# Patient Record
Sex: Male | Born: 1990 | Race: White | Hispanic: No | Marital: Single | Smoking: Current some day smoker
Health system: Southern US, Community
[De-identification: ages and names within clinical notes are randomized; demographics above are authoritative.]

## PROBLEM LIST (undated history)

## (undated) DIAGNOSIS — N200 Calculus of kidney: Secondary | ICD-10-CM

## (undated) DIAGNOSIS — J45909 Unspecified asthma, uncomplicated: Secondary | ICD-10-CM

---

## 2017-05-20 ENCOUNTER — Encounter (HOSPITAL_COMMUNITY): Payer: Self-pay | Admitting: Emergency Medicine

## 2017-05-20 ENCOUNTER — Emergency Department (HOSPITAL_COMMUNITY): Payer: BLUE CROSS/BLUE SHIELD

## 2017-05-20 ENCOUNTER — Emergency Department (HOSPITAL_COMMUNITY)
Admission: EM | Admit: 2017-05-20 | Discharge: 2017-05-20 | Disposition: A | Discharge status: Home / Self Care | Payer: BLUE CROSS/BLUE SHIELD | Attending: Emergency Medicine | Admitting: Emergency Medicine

## 2017-05-20 DIAGNOSIS — F909 Attention-deficit hyperactivity disorder, unspecified type: Secondary | ICD-10-CM | POA: Diagnosis not present

## 2017-05-20 DIAGNOSIS — F17200 Nicotine dependence, unspecified, uncomplicated: Secondary | ICD-10-CM | POA: Diagnosis not present

## 2017-05-20 DIAGNOSIS — R0789 Other chest pain: Secondary | ICD-10-CM | POA: Insufficient documentation

## 2017-05-20 DIAGNOSIS — R079 Chest pain, unspecified: Secondary | ICD-10-CM | POA: Diagnosis present

## 2017-05-20 DIAGNOSIS — J45909 Unspecified asthma, uncomplicated: Secondary | ICD-10-CM | POA: Insufficient documentation

## 2017-05-20 DIAGNOSIS — Z79899 Other long term (current) drug therapy: Secondary | ICD-10-CM | POA: Insufficient documentation

## 2017-05-20 HISTORY — DX: Unspecified asthma, uncomplicated: J45.909

## 2017-05-20 LAB — D-DIMER, QUANTITATIVE: D-Dimer, Quant: <0.27 ug/mL-FEU (ref 0.00–0.50)

## 2017-05-20 LAB — BASIC METABOLIC PANEL
ANION GAP: 10 (ref 5–15)
BUN: 10 mg/dL (ref 6–20)
CO2: 24 mmol/L (ref 22–32)
Calcium: 9.7 mg/dL (ref 8.9–10.3)
Chloride: 103 mmol/L (ref 101–111)
Creatinine, Ser: 0.89 mg/dL (ref 0.61–1.24)
GFR calc Af Amer: >60 mL/min (ref >60)
GFR calc non Af Amer: >60 mL/min (ref >60)
GLUCOSE: 115 mg/dL — AB (ref 65–99)
POTASSIUM: 3.7 mmol/L (ref 3.5–5.1)
Sodium: 137 mmol/L (ref 135–145)

## 2017-05-20 LAB — CBC
HEMATOCRIT: 47.6 % (ref 39.0–52.0)
HEMOGLOBIN: 16.1 g/dL (ref 13.0–17.0)
MCH: 30.4 pg (ref 26.0–34.0)
MCHC: 33.8 g/dL (ref 30.0–36.0)
MCV: 90 fL (ref 78.0–100.0)
Platelets: 237 10*3/uL (ref 150–400)
RBC: 5.29 MIL/uL (ref 4.22–5.81)
RDW: 13 % (ref 11.5–15.5)
WBC: 9.1 10*3/uL (ref 4.0–10.5)

## 2017-05-20 LAB — TROPONIN I

## 2017-05-20 LAB — I-STAT TROPONIN, ED: Troponin i, poc: 0 ng/mL (ref 0.00–0.08)

## 2017-05-20 MED ORDER — GI COCKTAIL ~~LOC~~
30.0000 mL | Freq: Once | ORAL | Status: AC
  Administered 2017-05-20: 30 mL via ORAL
  Filled 2017-05-20: qty 30

## 2017-05-20 NOTE — ED Provider Notes (Signed)
[facility]-EMERGENCY DEPT Provider Note   CSN: [id number] Arrival date & time: 05/20/17  1507  History   Chief Complaint Chief Complaint  Patient presents with  . Chest Pain    HPI Robert Strong is a 26 y.o. male.  This is a 26 year old male with PMH of ADHD on Adderall who presents with chest pain which began suddenly at 17 PM last night when he was sitting on his couch watching TV.  He endorses nausea as well and he states his chest pain has decreased in intensity however was still present today which warranted a visit at urgent care where EKG was performed and chest x-ray performed.  He was sent to the ED for further workup given his continued right-sided chest pain which radiates to his right arm and has intermittent tingling of his right arm.  No family history of cardiac disease below the age of 61, no personal history of heart disease, no history of HTN, T2 DM, cocaine or other drug abuse.  He smokes half a pack of cigarettes daily, denies any illicit drug usage.  He states he missed his Adderall dose today.  Denies any history of PE, recent travel, fevers, night sweats, chills, cough, blurry vision, weakness, leg swelling.   The history is provided by the patient and medical records.    Past Medical History:  Diagnosis Date  . Asthma     There are no active problems to display for this patient.   History reviewed. No pertinent surgical history.     Home Medications    Prior to Admission medications   Not on File    Family History History reviewed. No pertinent family history.  Social History Social History  Substance Use Topics  . Smoking status: Current Every Day Smoker    Packs/day: 0.50  . Smokeless tobacco: Never Used  . Alcohol use Yes     Comment: socially     Allergies   Patient has no allergy information on record.   Review of Systems Review of Systems  Constitutional: Negative for chills and fever.  HENT: Negative for ear pain and sore  throat.   Eyes: Negative for pain and visual disturbance.  Respiratory: Positive for shortness of breath. Negative for cough.   Cardiovascular: Positive for chest pain. Negative for palpitations and leg swelling.  Gastrointestinal: Negative for abdominal pain and vomiting.  Genitourinary: Negative for dysuria and hematuria.  Musculoskeletal: Negative for arthralgias and back pain.  Skin: Negative for color change and rash.  Neurological: Negative for dizziness, tremors, seizures, syncope, speech difficulty, weakness, light-headedness, numbness and headaches.  All other systems reviewed and are negative.    Physical Exam Updated Vital Signs BP 116/81   Pulse 84   Temp 98.7 F (37.1 C) (Oral)   Resp 14   Ht  (1.778 m)   Wt 95.3 kg (210 lb)   SpO2 99%   BMI 30.13 kg/m   Physical Exam  Constitutional: He appears well-developed and well-nourished.  HENT:  Head: Normocephalic and atraumatic.  Eyes: Conjunctivae are normal.  Neck: Neck supple.  Cardiovascular: Normal rate, regular rhythm and normal pulses.  Exam reveals no decreased pulses.   No murmur heard. Pulmonary/Chest: Effort normal and breath sounds normal. No respiratory distress.  Abdominal: Soft. There is no tenderness.  Musculoskeletal: Normal range of motion. He exhibits no edema.  Neurological: He is alert.  Skin: Skin is warm and dry.  Psychiatric: He has a normal mood and affect.  Nursing note and  vitals reviewed.    ED Treatments / Results  Labs (all labs ordered are listed, but only abnormal results are displayed) Labs Reviewed  BASIC METABOLIC PANEL - Abnormal; Notable for the following:       Result Value   Glucose, Bld 115 (*)    All other components within normal limits  CBC  D-DIMER, QUANTITATIVE (NOT AT [facility])  TROPONIN I  I-STAT TROPONIN, ED    EKG  EKG Interpretation  Date/Time:  Thursday May 20 2017 15:12:56 EDT Ventricular Rate:  140 PR Interval:  128 QRS Duration: 90 QT  Interval:  288 QTC Calculation: 439 R Axis:   100 Text Interpretation:  Sinus tachycardia Right atrial enlargement Rightward axis Pulmonary disease pattern Junctional ST depression, probably normal Abnormal ECG No previous ECGs available Confirmed by Frederick Peers [phone]) on 05/20/2017 3:27:24 PM Also confirmed by Frederick Peers [phone]), editor Barbette Hair [phone])  on 05/20/2017 4:16:48 PM       Radiology Dg Chest 2 View  Result Date: 05/20/2017 CLINICAL DATA:  Chest pain beginning last night right side radiating down right arm with nausea. EXAM: CHEST  2 VIEW COMPARISON:  None. FINDINGS: Lungs are adequately inflated and otherwise clear. Cardiomediastinal silhouette is within normal. Evidence of an old right clavicle fracture. IMPRESSION: No active cardiopulmonary disease. Electronically Signed   By: Elberta Fortis M.D.   On: 05/20/2017 16:25    Procedures Procedures (including critical care time)  Medications Ordered in ED Medications  gi cocktail (Maalox,Lidocaine,Donnatal) (not administered)     Initial Impression / Assessment and Plan / ED Course  I have reviewed the triage vital signs and the nursing notes.  Pertinent labs & imaging results that were available during my care of the patient were reviewed by me and considered in my medical decision making (see chart for details).     This is a 26 year old male with PMH of ADHD on Adderall who presents with chest pain which began suddenly at 4 PM last night when he was sitting on his couch watching TV. He had this pain several weeks ago but it resolved on its own. He presented today due to right arm radiation of his pain.  Physical exam as detailed above.  ECG is normal sinus rhythm and rate, without evidence of ST or T wave changes of myocardial ischemia.    No EKG findings of HOCM, WPW, Brugada, pre-excitation or prolonged QT. No tachycardia, no S1Q3T3 or right ventricular heart strain suggestive of PE.    Because of the  age and risk factors of the patient, ACS will be ruled out with troponin x 2. Chest XR and EKG performed. EKG: there are no previous tracings available for comparison, sinus tachycardia.  HEART score calculation: 1  Unlikely PNA as CXR shows no acute cardiopulmonary abnormality, no leukocytosis, no cough, no fever.   Cannot rule out via PERC due to tachycardia. D-dimer performed and negative.  Doubt Aortic Dissection, Pancreatitis, Arrhythmia, Pneumothorax, Endo/Myo/Pericarditis, ulcer perforation or other emergent pathology.  Labs and imaging reviewed by myself and considered in medical decision making.  Imaging interpreted by radiology.   At this time, given age and lack of risk factors, I believe chest pain to be benign cause. Patient will be discharged home is follow up with PCP. Patient in agreement with plan. Return precautions given. All questions answered.  Final Clinical Impressions(s) / ED Diagnoses   Final diagnoses:  Chest wall pain   New Prescriptions New Prescriptions   No medications on file  Shaune Pollack, MD 05/20/17 2214    Clarene Duke Ambrose Finland, MD 05/21/17 [phone]

## 2017-05-20 NOTE — ED Notes (Signed)
Pt departed in NAD.  

## 2017-05-20 NOTE — ED Triage Notes (Signed)
Pt presents to ED for assessment of CP starting last night at 11pm.  Pt stats he had trouble sleeping because of it.  CP was on the right side, with shoulder pain, right arm numbness, diaphoresis, and one episode of vomiting.  Pt states CP has worsened today while at work, and is starting to move to the left side.  No cardiac history.

## 2019-10-12 ENCOUNTER — Ambulatory Visit: Payer: BC Managed Care – PPO | Attending: Family

## 2019-10-12 DIAGNOSIS — Z23 Encounter for immunization: Secondary | ICD-10-CM | POA: Insufficient documentation

## 2019-10-13 NOTE — Progress Notes (Signed)
   Covid-19 Vaccination Clinic  Name:  AARYAV HOPFENSPERGER    MRN: [id number] DOB: 1990/09/06  10/13/2019  Mr. Mi was observed post Covid-19 immunization for 30 minutes based on pre-vaccination screening without incidence. He was provided with Vaccine Information Sheet and instruction to access the V-Safe system.   Mr. Hoen was instructed to call 911 with any severe reactions post vaccine: Marland Kitchen Difficulty breathing  . Swelling of your face and throat  . A fast heartbeat  . A bad rash all over your body  . Dizziness and weakness    Immunizations Administered    Name Date Dose VIS Date Route   Moderna COVID-19 Vaccine 10/12/2019  5:24 AM 0.5 mL 08/01/2019 Intramuscular   Manufacturer: Moderna   Lot: [id number]   NDC: 99833-825-05

## 2019-11-14 ENCOUNTER — Ambulatory Visit: Payer: BC Managed Care – PPO | Attending: Family

## 2019-11-14 DIAGNOSIS — Z23 Encounter for immunization: Secondary | ICD-10-CM

## 2019-11-14 NOTE — Progress Notes (Signed)
   Covid-19 Vaccination Clinic  Name:  Robert Strong    MRN: [id number] DOB: [birth year]  11/14/2019  [name] was observed post Covid-19 immunization for 15 minutes without incident. He was provided with Vaccine Information Sheet and instruction to access the V-Safe system.   [name] was instructed to call 911 with any severe reactions post vaccine: Marland Kitchen Difficulty breathing  . Swelling of face and throat  . A fast heartbeat  . A bad rash all over body  . Dizziness and weakness   Immunizations Administered    Name Date Dose VIS Date Route   Moderna COVID-19 Vaccine 11/14/2019  2:20 PM 0.5 mL 08/01/2019 Intramuscular   Manufacturer: Moderna   Lot: [id number]   NDC: 83015-996-89

## 2021-06-30 ENCOUNTER — Emergency Department (HOSPITAL_COMMUNITY)
Admission: EM | Admit: 2021-06-30 | Discharge: 2021-07-01 | Disposition: A | Discharge status: Home / Self Care | Payer: BC Managed Care – PPO | Attending: Emergency Medicine | Admitting: Emergency Medicine

## 2021-06-30 ENCOUNTER — Emergency Department (HOSPITAL_COMMUNITY): Payer: BC Managed Care – PPO

## 2021-06-30 ENCOUNTER — Other Ambulatory Visit: Payer: Self-pay

## 2021-06-30 ENCOUNTER — Encounter (HOSPITAL_COMMUNITY): Payer: Self-pay

## 2021-06-30 DIAGNOSIS — N201 Calculus of ureter: Secondary | ICD-10-CM | POA: Insufficient documentation

## 2021-06-30 DIAGNOSIS — F17210 Nicotine dependence, cigarettes, uncomplicated: Secondary | ICD-10-CM | POA: Diagnosis not present

## 2021-06-30 DIAGNOSIS — R109 Unspecified abdominal pain: Secondary | ICD-10-CM | POA: Diagnosis present

## 2021-06-30 DIAGNOSIS — J45909 Unspecified asthma, uncomplicated: Secondary | ICD-10-CM | POA: Diagnosis not present

## 2021-06-30 LAB — CBC WITH DIFFERENTIAL/PLATELET
Abs Immature Granulocytes: 0.05 10*3/uL (ref 0.00–0.07)
Basophils Absolute: 0.1 10*3/uL (ref 0.0–0.1)
Basophils Relative: 0 %
Eosinophils Absolute: 0.1 10*3/uL (ref 0.0–0.5)
Eosinophils Relative: 1 %
HCT: 43.6 % (ref 39.0–52.0)
Hemoglobin: 14.5 g/dL (ref 13.0–17.0)
Immature Granulocytes: 0 %
Lymphocytes Relative: 9 %
Lymphs Abs: 1.4 10*3/uL (ref 0.7–4.0)
MCH: 30.4 pg (ref 26.0–34.0)
MCHC: 33.3 g/dL (ref 30.0–36.0)
MCV: 91.4 fL (ref 80.0–100.0)
Monocytes Absolute: 1.3 10*3/uL — ABNORMAL HIGH (ref 0.1–1.0)
Monocytes Relative: 8 %
Neutro Abs: 13.8 10*3/uL — ABNORMAL HIGH (ref 1.7–7.7)
Neutrophils Relative %: 82 %
Platelets: 265 10*3/uL (ref 150–400)
RBC: 4.77 MIL/uL (ref 4.22–5.81)
RDW: 12.8 % (ref 11.5–15.5)
WBC: 16.7 10*3/uL — ABNORMAL HIGH (ref 4.0–10.5)
nRBC: 0 % (ref 0.0–0.2)

## 2021-06-30 LAB — COMPREHENSIVE METABOLIC PANEL
ALT: 25 U/L (ref 0–44)
AST: 18 U/L (ref 15–41)
Albumin: 4.4 g/dL (ref 3.5–5.0)
Alkaline Phosphatase: 54 U/L (ref 38–126)
Anion gap: 11 (ref 5–15)
BUN: 17 mg/dL (ref 6–20)
CO2: 22 mmol/L (ref 22–32)
Calcium: 10.1 mg/dL (ref 8.9–10.3)
Chloride: 105 mmol/L (ref 98–111)
Creatinine, Ser: 0.85 mg/dL (ref 0.61–1.24)
GFR, Estimated: >60 mL/min (ref >60)
Glucose, Bld: 108 mg/dL — ABNORMAL HIGH (ref 70–99)
Potassium: 3.5 mmol/L (ref 3.5–5.1)
Sodium: 138 mmol/L (ref 135–145)
Total Bilirubin: 0.5 mg/dL (ref 0.3–1.2)
Total Protein: 8.1 g/dL (ref 6.5–8.1)

## 2021-06-30 LAB — LIPASE, BLOOD: Lipase: 35 U/L (ref 11–51)

## 2021-06-30 MED ORDER — HYDROMORPHONE HCL 1 MG/ML IJ SOLN
1.0000 mg | Freq: Once | INTRAMUSCULAR | Status: AC
  Administered 2021-07-01: 1 mg via INTRAVENOUS
  Filled 2021-06-30: qty 1

## 2021-06-30 MED ORDER — ONDANSETRON HCL 4 MG/2ML IJ SOLN
4.0000 mg | Freq: Once | INTRAMUSCULAR | Status: AC
  Administered 2021-07-01: 4 mg via INTRAVENOUS
  Filled 2021-06-30: qty 2

## 2021-06-30 MED ORDER — OXYCODONE-ACETAMINOPHEN 5-325 MG PO TABS
1.0000 | ORAL_TABLET | Freq: Once | ORAL | Status: AC
  Administered 2021-06-30: 1 via ORAL
  Filled 2021-06-30: qty 1

## 2021-06-30 MED ORDER — ONDANSETRON 8 MG PO TBDP
8.0000 mg | ORAL_TABLET | Freq: Once | ORAL | Status: AC
  Administered 2021-06-30: 8 mg via ORAL
  Filled 2021-06-30: qty 1

## 2021-06-30 NOTE — ED Notes (Signed)
Labeled specimen cup provided to pt for urine collection per MD order. ENMiles 

## 2021-06-30 NOTE — ED Provider Notes (Signed)
Emergency Medicine Provider Triage Evaluation Note  Robert Strong , a 30 y.o. male  was evaluated in triage.  Pt complains of right flank pain.  Started acutely 1 hour ago.  Having some dysuria, no hematuria.  No prior abdominal surgeries, associated with 1 episode of emesis and nausea..   Review of Systems  Positive: Right flank pain, dysuria, nausea, vomiting Negative: Hematuria  Physical Exam  BP (!) 146/104 (BP Location: Left Arm)   Pulse 70   Temp 97.6 F (36.4 C) (Oral)   Resp 18   SpO2 100%  Gen:   Awake, no distress   Resp:  Normal effort  MSK:   Moves extremities without difficulty  Other:  Right CVA tenderness  Medical Decision Making  Medically screening exam initiated at 7:05 PM.  Appropriate orders placed.  Robert Strong was informed that the remainder of the evaluation will be completed by another provider, this initial triage assessment does not replace that evaluation, and the importance of remaining in the ED until their evaluation is complete.  Abdominal pain work-up   Theron Arista, Cordelia Poche 06/30/21 1906    Jacalyn Lefevre, MD 06/30/21 2015

## 2021-06-30 NOTE — ED Provider Notes (Signed)
[facility]-EMERGENCY DEPT Provider Note   CSN: [id number] Arrival date & time: 06/30/21  1837     History Chief Complaint  Patient presents with   Flank Pain    Robert Strong is a 30 y.o. male.  The history is provided by the patient and medical records.  Flank Pain  30 year old male with history of asthma, presenting to the ED with right-sided flank pain.  States it began about 1 hour prior to arrival.  States started in right groin but now seems more localized to his right flank.  States history of kidney stones in the past with similar symptoms.  Emesis x6 since pain began, seems to be coming in waves.  He has not had any fever.  Prior stone passed spontaneously, no required stenting or lithotripsy.  Did have some pain medication in triage but that is since worn off.  Past Medical History:  Diagnosis Date   Asthma     There are no problems to display for this patient.   History reviewed. No pertinent surgical history.     History reviewed. No pertinent family history.  Social History   Tobacco Use   Smoking status: Every Day    Packs/day: 0.50    Types: Cigarettes   Smokeless tobacco: Never  Substance Use Topics   Alcohol use: Yes    Comment: socially   Drug use: Yes    Types: Marijuana    Home Medications Prior to Admission medications   Medication Sig Start Date End Date Taking? Authorizing Provider  ondansetron (ZOFRAN ODT) 4 MG disintegrating tablet Take 1 tablet (4 mg total) by mouth every 8 (eight) hours as needed for nausea. 07/01/21  Yes Garlon Hatchet, PA-C  oxyCODONE-acetaminophen (PERCOCET) 5-325 MG tablet Take 1 tablet by mouth every 4 (four) hours as needed. 07/01/21  Yes Garlon Hatchet, PA-C  tamsulosin (FLOMAX) 0.4 MG CAPS capsule Take 1 capsule (0.4 mg total) by mouth daily after supper. 07/01/21  Yes Garlon Hatchet, PA-C    Allergies    Patient has no known allergies.  Review of Systems   Review of Systems   Gastrointestinal:  Positive for nausea and vomiting.  Genitourinary:  Positive for flank pain.  All other systems reviewed and are negative.  Physical Exam Updated Vital Signs BP (!) 170/96   Pulse 82   Temp 97.6 F (36.4 C) (Oral)   Resp 15   SpO2 96%   Physical Exam Vitals and nursing note reviewed.  Constitutional:      Appearance: He is well-developed.  HENT:     Head: Normocephalic and atraumatic.  Eyes:     Conjunctiva/sclera: Conjunctivae normal.     Pupils: Pupils are equal, round, and reactive to light.  Cardiovascular:     Rate and Rhythm: Normal rate and regular rhythm.     Heart sounds: Normal heart sounds.  Pulmonary:     Effort: Pulmonary effort is normal. No respiratory distress.     Breath sounds: Normal breath sounds. No rhonchi.  Abdominal:     General: Bowel sounds are normal.     Palpations: Abdomen is soft.     Tenderness: There is no abdominal tenderness. There is no rebound.  Musculoskeletal:        General: Normal range of motion.     Cervical back: Normal range of motion.  Skin:    General: Skin is warm and dry.  Neurological:     Mental Status: He is alert and  oriented to person, place, and time.    ED Results / Procedures / Treatments   Labs (all labs ordered are listed, but only abnormal results are displayed) Labs Reviewed  CBC WITH DIFFERENTIAL/PLATELET - Abnormal; Notable for the following components:      Result Value   WBC 16.7 (*)    Neutro Abs 13.8 (*)    Monocytes Absolute 1.3 (*)    All other components within normal limits  COMPREHENSIVE METABOLIC PANEL - Abnormal; Notable for the following components:   Glucose, Bld 108 (*)    All other components within normal limits  URINALYSIS, ROUTINE W REFLEX MICROSCOPIC - Abnormal; Notable for the following components:   Color, Urine STRAW (*)    Hgb urine dipstick MODERATE (*)    All other components within normal limits  LIPASE, BLOOD    EKG None  Radiology CT Renal  Stone Study  Result Date: 07/01/2021 CLINICAL DATA:  Flank pain.  Concern for kidney stone. EXAM: CT ABDOMEN AND PELVIS WITHOUT CONTRAST TECHNIQUE: Multidetector CT imaging of the abdomen and pelvis was performed following the standard protocol without IV contrast. COMPARISON:  None. FINDINGS: Evaluation of this exam is limited in the absence of intravenous contrast. Lower chest: Minimal bibasilar dependent atelectasis. The visualized lung bases are otherwise clear. No intra-abdominal free air or free fluid. Hepatobiliary: No focal liver abnormality is seen. No gallstones, gallbladder wall thickening, or biliary dilatation. Pancreas: Unremarkable. No pancreatic ductal dilatation or surrounding inflammatory changes. Spleen: Normal in size without focal abnormality. Adrenals/Urinary Tract: The adrenal glands are unremarkable. Several nonobstructing left renal calculi measure up to 3 mm. There is no hydronephrosis on the left. No stone noted in the right kidney. There is a 6 mm distal right ureteral calculus with mild right hydronephrosis. The urinary bladder is unremarkable. Stomach/Bowel: There is no bowel obstruction or active inflammation. The appendix is normal. Vascular/Lymphatic: The abdominal aorta and IVC unremarkable. No portal venous gas. There is no adenopathy. Reproductive: The prostate and seminal vesicles are grossly unremarkable. No pelvic mass. Other: Small fat containing umbilical hernia. Musculoskeletal: No acute or significant osseous findings. IMPRESSION: 1. A 6 mm distal right ureteral calculus with mild right hydronephrosis. 2. Nonobstructing left renal calculi. No hydronephrosis on the left. 3. No bowel obstruction. Normal appendix. Electronically Signed   By: Anner Crete M.D.   On: 07/01/2021 01:22    Procedures Procedures   Medications Ordered in ED Medications  oxyCODONE-acetaminophen (PERCOCET/ROXICET) 5-325 MG per tablet 1 tablet (has no administration in time range)   ondansetron (ZOFRAN-ODT) disintegrating tablet 8 mg (8 mg Oral Given 06/30/21 2122)  oxyCODONE-acetaminophen (PERCOCET/ROXICET) 5-325 MG per tablet 1 tablet (1 tablet Oral Given 06/30/21 2122)  HYDROmorphone (DILAUDID) injection 1 mg (1 mg Intravenous Given 07/01/21 0041)  ondansetron (ZOFRAN) injection 4 mg (4 mg Intravenous Given 07/01/21 0042)  HYDROmorphone (DILAUDID) injection 1 mg (1 mg Intravenous Given 07/01/21 0201)  ketorolac (TORADOL) 30 MG/ML injection 30 mg (30 mg Intravenous Given 07/01/21 0201)    ED Course  I have reviewed the triage vital signs and the nursing notes.  Pertinent labs & imaging results that were available during my care of the patient were reviewed by me and considered in my medical decision making (see chart for details).    MDM Rules/Calculators/A&P                           30 year old male presenting to the ED with right-sided flank pain  that began 1 hour prior to arrival.  History of kidney stones and reports this feels similar.  He is afebrile and nontoxic.  He is not have any focal CVA tenderness.  Labs overall reassuring.  UA is pending.  Will get CT renal stone study.  Dilaudid given for pain.  CT renal stone with 6 mm distal right ureteral calculus.  There is some mild hydronephrosis.  Has been urinating here, hematuria without signs of infection.  Patient is much more comfortable after few doses of pain medication.  Feel he is stable for discharge.  We have discussed expectant management.  He is given urology follow-up.  Can return here for any new or acute changes.  Final Clinical Impression(s) / ED Diagnoses Final diagnoses:  Ureteral stone    Rx / DC Orders ED Discharge Orders          Ordered    oxyCODONE-acetaminophen (PERCOCET) 5-325 MG tablet  Every 4 hours PRN        07/01/21 0248    ondansetron (ZOFRAN ODT) 4 MG disintegrating tablet  Every 8 hours PRN        07/01/21 0248    tamsulosin (FLOMAX) 0.4 MG CAPS capsule  Daily after  supper        07/01/21 0248             Larene Pickett, PA-C 07/01/21 0254    Ezequiel Essex, MD 07/01/21 [phone]

## 2021-06-30 NOTE — ED Triage Notes (Signed)
Pt reports right sided flank pain that began about 30-40 minutes ago. Denies N/V/D and urinary problems.

## 2021-07-01 ENCOUNTER — Emergency Department (HOSPITAL_COMMUNITY): Payer: BC Managed Care – PPO

## 2021-07-01 LAB — URINALYSIS, ROUTINE W REFLEX MICROSCOPIC
Bacteria, UA: NONE SEEN
Bilirubin Urine: NEGATIVE
Glucose, UA: NEGATIVE mg/dL
Ketones, ur: NEGATIVE mg/dL
Leukocytes,Ua: NEGATIVE
Nitrite: NEGATIVE
Protein, ur: NEGATIVE mg/dL
Specific Gravity, Urine: 1.01 (ref 1.005–1.030)
pH: 6 (ref 5.0–8.0)

## 2021-07-01 MED ORDER — OXYCODONE-ACETAMINOPHEN 5-325 MG PO TABS
1.0000 | ORAL_TABLET | Freq: Once | ORAL | Status: AC
  Administered 2021-07-01: 1 via ORAL
  Filled 2021-07-01: qty 1

## 2021-07-01 MED ORDER — HYDROMORPHONE HCL 1 MG/ML IJ SOLN
1.0000 mg | Freq: Once | INTRAMUSCULAR | Status: AC
  Administered 2021-07-01: 1 mg via INTRAVENOUS
  Filled 2021-07-01: qty 1

## 2021-07-01 MED ORDER — KETOROLAC TROMETHAMINE 30 MG/ML IJ SOLN
30.0000 mg | Freq: Once | INTRAMUSCULAR | Status: AC
  Administered 2021-07-01: 30 mg via INTRAVENOUS
  Filled 2021-07-01: qty 1

## 2021-07-01 MED ORDER — OXYCODONE-ACETAMINOPHEN 5-325 MG PO TABS: 1.0000 | ORAL_TABLET | ORAL | 0 refills | Status: AC | PRN

## 2021-07-01 MED ORDER — ONDANSETRON 4 MG PO TBDP: 4.0000 mg | ORAL_TABLET | Freq: Three times a day (TID) | ORAL | 0 refills | Status: AC | PRN

## 2021-07-01 MED ORDER — TAMSULOSIN HCL 0.4 MG PO CAPS: 0.4000 mg | ORAL_CAPSULE | Freq: Every day | ORAL | 0 refills | Status: AC

## 2021-07-01 NOTE — Discharge Instructions (Signed)
Take the prescribed medication as directed.  Make sure to drink lots of water. Follow-up with urology-- can call for appt. Return to the ED for new or worsening symptoms-- fever, uncontrolled pain, etc.

## 2021-07-01 NOTE — ED Notes (Signed)
Patient transported to CT 

## 2022-06-29 IMAGING — CT CT RENAL STONE PROTOCOL
2 of 4 series · 16 of 46 positions shown, 18 images · non-contrast
Comparison: None.

CLINICAL DATA: Flank pain.  Concern for kidney stone.

EXAM:
CT ABDOMEN AND PELVIS WITHOUT CONTRAST
TECHNIQUE: Multidetector CT imaging of the abdomen and pelvis was performed
following the standard protocol without IV contrast.

[Series 2: axial st · axial · 0.77mm/px · z∈[+1178,+1613]mm · 13 of 99 slices shown, 15 images]
[im 6/99  soft-tissue]
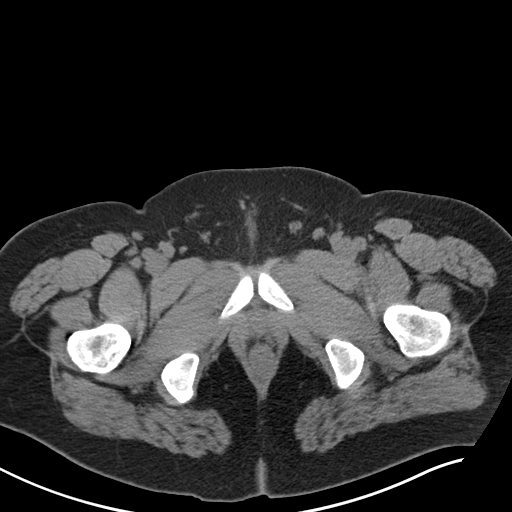
[im 6/99  bone]
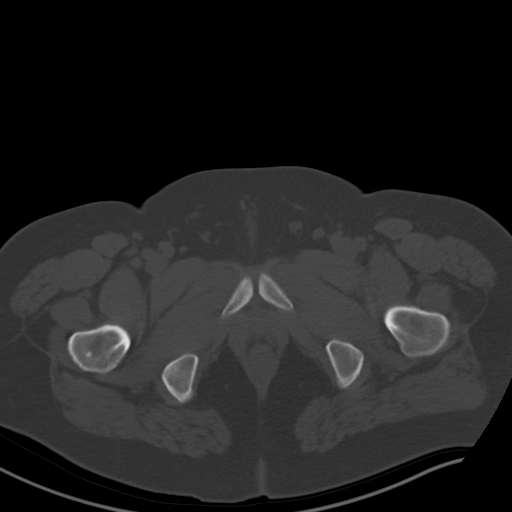
[im 16/99  soft-tissue]
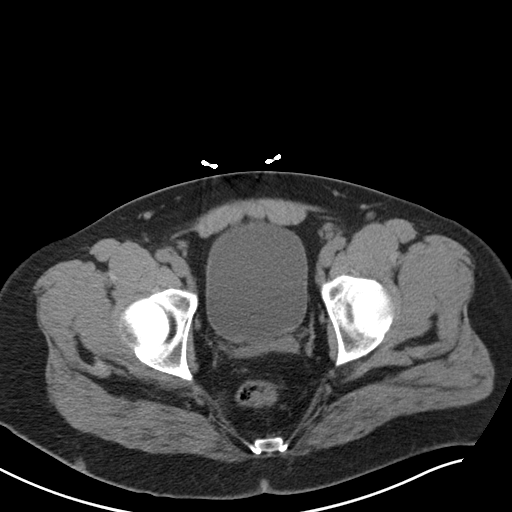
[im 21/99  soft-tissue]
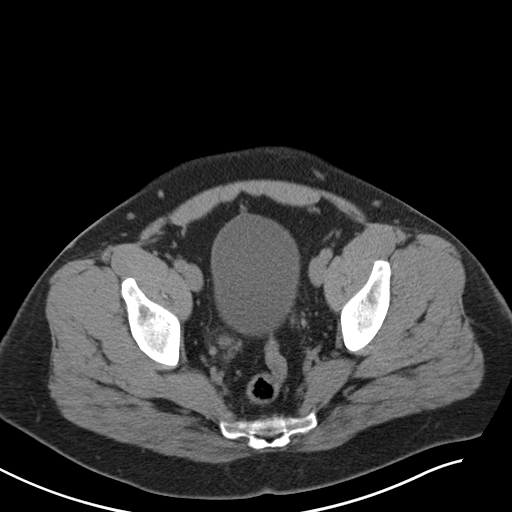
[im 26/99  soft-tissue]
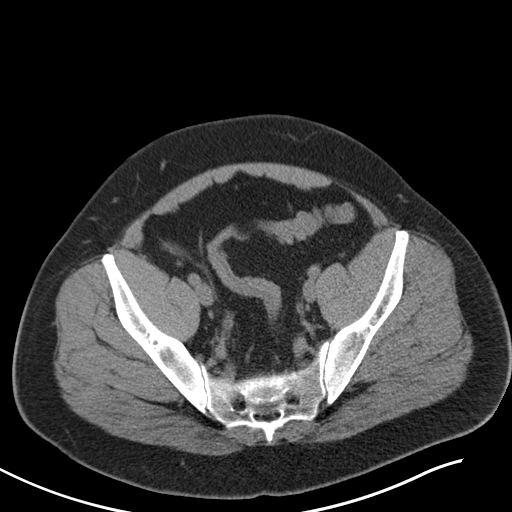
[im 37/99  soft-tissue]
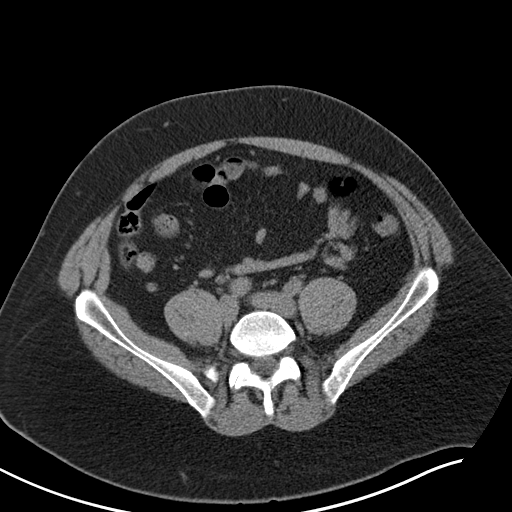
[im 42/99  soft-tissue]
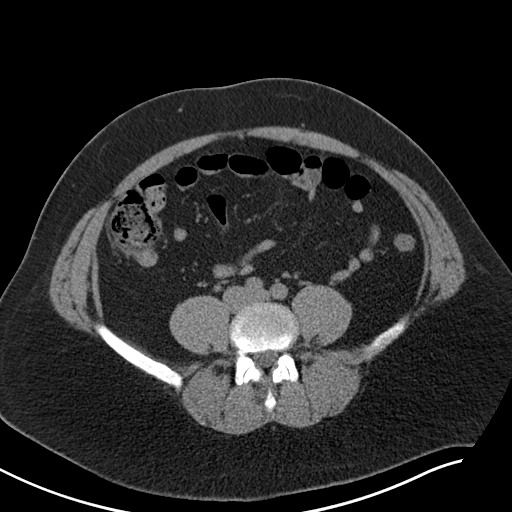
[im 52/99  soft-tissue]
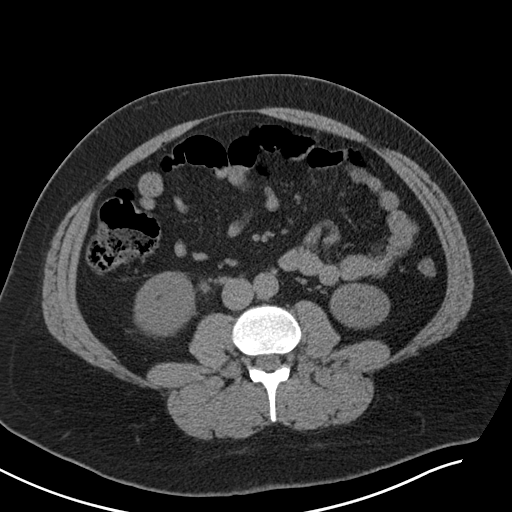
[im 57/99  soft-tissue]
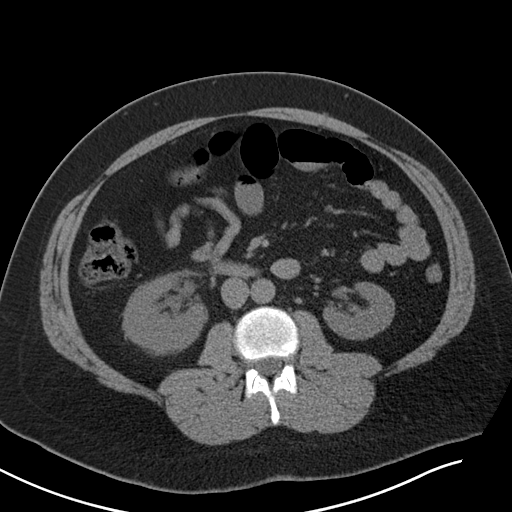
[im 62/99  soft-tissue]
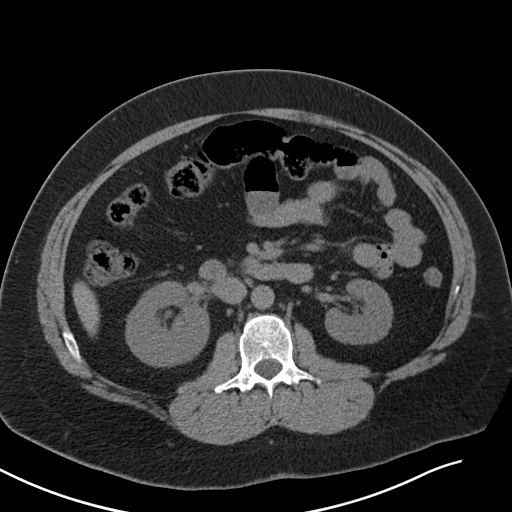
[im 62/99  bone]
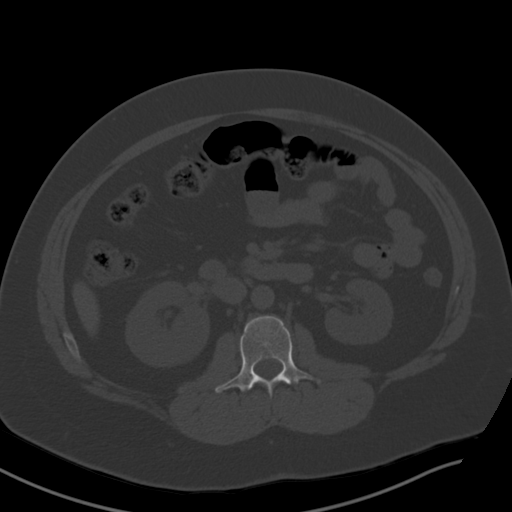
[im 73/99  soft-tissue]
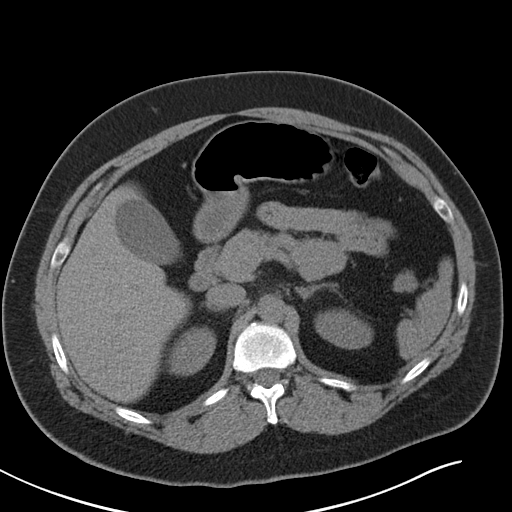
[im 78/99  soft-tissue]
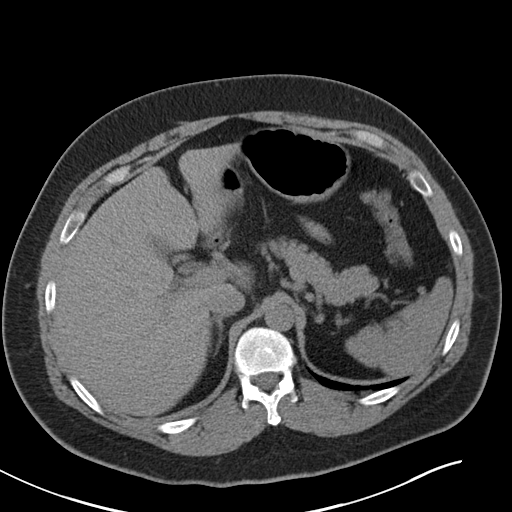
[im 83/99  soft-tissue]
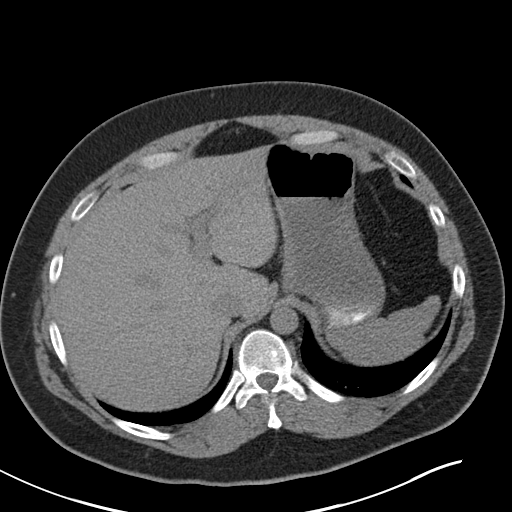
[im 93/99  soft-tissue]
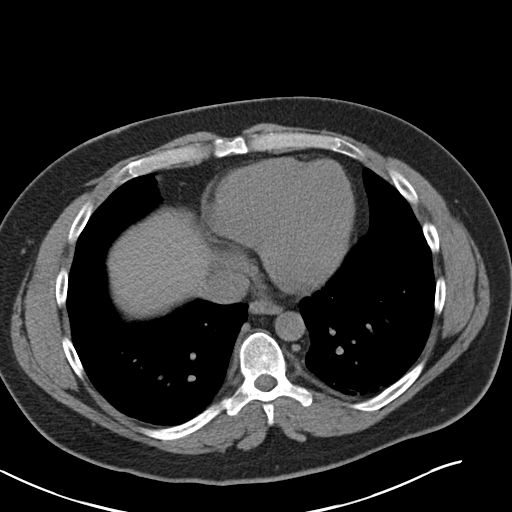

[Series 4: coronal · coronal · 0.83mm/px · 3 of 151 slices shown]
[im 51/151  soft-tissue]
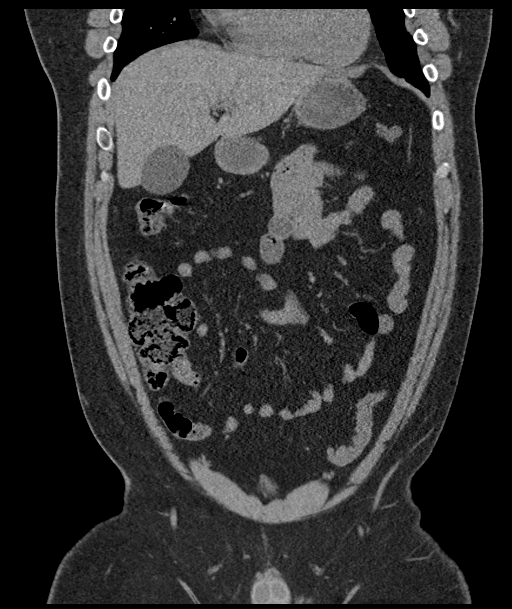
[im 67/151  soft-tissue]
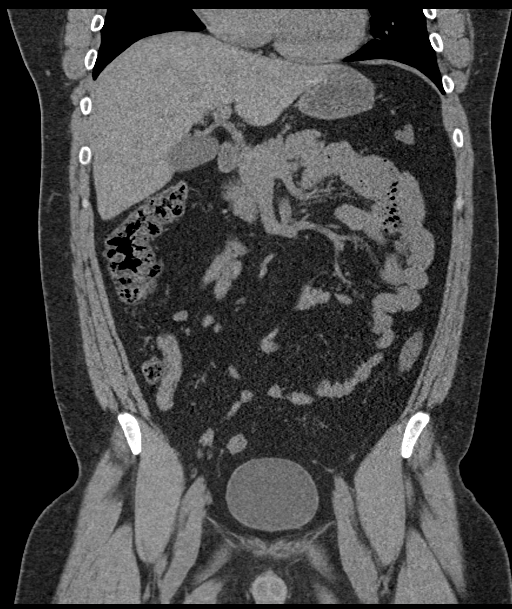
[im 84/151  soft-tissue]
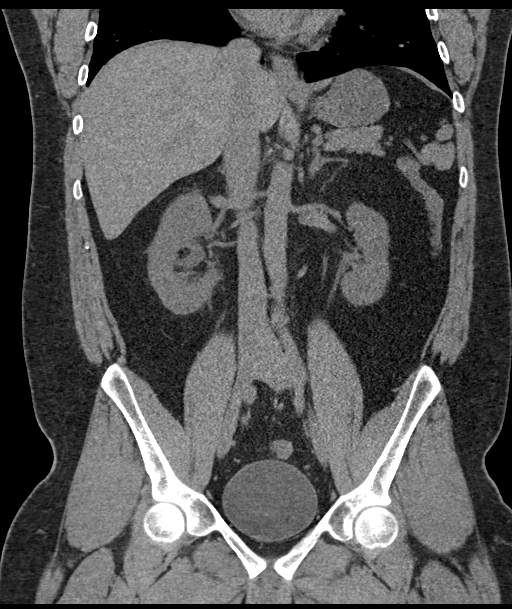

[16 of 46 positions shown; findings below may reference images not displayed]

FINDINGS: Evaluation of this exam is limited in the absence of intravenous
contrast.

Lower chest: Minimal bibasilar dependent atelectasis. The visualized
lung bases are otherwise clear.

No intra-abdominal free air or free fluid.

Hepatobiliary: No focal liver abnormality is seen. No gallstones,
gallbladder wall thickening, or biliary dilatation.

Pancreas: Unremarkable. No pancreatic ductal dilatation or
surrounding inflammatory changes.

Spleen: Normal in size without focal abnormality.

Adrenals/Urinary Tract: The adrenal glands are unremarkable. Several
nonobstructing left renal calculi measure up to 3 mm. There is no
hydronephrosis on the left. No stone noted in the right kidney.
There is a 6 mm distal right ureteral calculus with mild right
hydronephrosis. The urinary bladder is unremarkable.

Stomach/Bowel: There is no bowel obstruction or active inflammation.
The appendix is normal.

Vascular/Lymphatic: The abdominal aorta and IVC unremarkable. No
portal venous gas. There is no adenopathy.

Reproductive: The prostate and seminal vesicles are grossly
unremarkable. No pelvic mass.

Other: Small fat containing umbilical hernia.

Musculoskeletal: No acute or significant osseous findings.
IMPRESSION: 1. A 6 mm distal right ureteral calculus with mild right
hydronephrosis.
2. Nonobstructing left renal calculi. No hydronephrosis on the left.
3. No bowel obstruction. Normal appendix.

## 2022-09-06 ENCOUNTER — Ambulatory Visit
Admission: EM | Admit: 2022-09-06 | Discharge: 2022-09-06 | Disposition: A | Discharge status: Home / Self Care | Payer: No Typology Code available for payment source | Attending: Urgent Care | Admitting: Urgent Care

## 2022-09-06 DIAGNOSIS — R062 Wheezing: Secondary | ICD-10-CM

## 2022-09-06 DIAGNOSIS — B349 Viral infection, unspecified: Secondary | ICD-10-CM | POA: Insufficient documentation

## 2022-09-06 DIAGNOSIS — Z79899 Other long term (current) drug therapy: Secondary | ICD-10-CM | POA: Diagnosis not present

## 2022-09-06 DIAGNOSIS — R058 Other specified cough: Secondary | ICD-10-CM | POA: Insufficient documentation

## 2022-09-06 DIAGNOSIS — F17210 Nicotine dependence, cigarettes, uncomplicated: Secondary | ICD-10-CM | POA: Diagnosis not present

## 2022-09-06 DIAGNOSIS — J4520 Mild intermittent asthma, uncomplicated: Secondary | ICD-10-CM | POA: Diagnosis not present

## 2022-09-06 DIAGNOSIS — Z1152 Encounter for screening for COVID-19: Secondary | ICD-10-CM | POA: Insufficient documentation

## 2022-09-06 HISTORY — DX: Calculus of kidney: N20.0

## 2022-09-06 MED ORDER — ALBUTEROL SULFATE HFA 108 (90 BASE) MCG/ACT IN AERS: 1.0000 | INHALATION_SPRAY | Freq: Four times a day (QID) | RESPIRATORY_TRACT | 0 refills | Status: AC | PRN

## 2022-09-06 MED ORDER — PROMETHAZINE-DM 6.25-15 MG/5ML PO SYRP: 5.0000 mL | ORAL_SOLUTION | Freq: Three times a day (TID) | ORAL | 0 refills | Status: AC | PRN

## 2022-09-06 MED ORDER — PREDNISONE 20 MG PO TABS: ORAL_TABLET | ORAL | 0 refills | Status: AC

## 2022-09-06 MED ORDER — CETIRIZINE HCL 10 MG PO TABS: 10.0000 mg | ORAL_TABLET | Freq: Every day | ORAL | 0 refills | Status: AC

## 2022-09-06 NOTE — ED Provider Notes (Signed)
[facility]  Note:  This document was prepared using Systems analyst and may include unintentional dictation errors.  MRN: [id number] DOB: [birth year]  Subjective:   Robert Strong is a 32 y.o. male presenting for 4-day history of acute onset coughing, sinus congestion, chest congestion, wheezing, shortness of breath, malaise and fatigue.  Has a history of intermittent asthma, has not needed an inhaler in years.  Patient smokes a few times per week.  No current facility-administered medications for this encounter.  Current Outpatient Medications:    ondansetron (ZOFRAN ODT) 4 MG disintegrating tablet, Take 1 tablet (4 mg total) by mouth every 8 (eight) hours as needed for nausea., Disp: 10 tablet, Rfl: 0   oxyCODONE-acetaminophen (PERCOCET) 5-325 MG tablet, Take 1 tablet by mouth every 4 (four) hours as needed., Disp: 20 tablet, Rfl: 0   tamsulosin (FLOMAX) 0.4 MG CAPS capsule, Take 1 capsule (0.4 mg total) by mouth daily after supper., Disp: 10 capsule, Rfl: 0   No Known Allergies  Past Medical History:  Diagnosis Date   Asthma    Kidney stone      History reviewed. No pertinent surgical history.  No family history on file.  Social History   Tobacco Use   Smoking status: Some Days    Packs/day: 0.50    Types: Cigarettes   Smokeless tobacco: Never  Vaping Use   Vaping Use: Never used  Substance Use Topics   Alcohol use: Yes    Comment: socially   Drug use: Not Currently    ROS   Objective:   Vitals: BP (!) 139/94 (BP Location: Left Arm)   Pulse 88   Temp 98.5 F (36.9 C) (Oral)   Resp 18   SpO2 96%   Physical Exam Constitutional:      General: He is not in acute distress.    Appearance: Normal appearance. He is well-developed and normal weight. He is not ill-appearing, toxic-appearing or diaphoretic.  HENT:     Head: Normocephalic and atraumatic.     Right Ear: Tympanic membrane, ear canal and external ear  normal. No drainage, swelling or tenderness. No middle ear effusion. There is no impacted cerumen. Tympanic membrane is not erythematous or bulging.     Left Ear: Tympanic membrane, ear canal and external ear normal. No drainage, swelling or tenderness.  No middle ear effusion. There is no impacted cerumen. Tympanic membrane is not erythematous or bulging.     Nose: Congestion and rhinorrhea present.     Mouth/Throat:     Mouth: Mucous membranes are moist.     Pharynx: No oropharyngeal exudate or posterior oropharyngeal erythema.  Eyes:     General: No scleral icterus.       Right eye: No discharge.        Left eye: No discharge.     Extraocular Movements: Extraocular movements intact.     Conjunctiva/sclera: Conjunctivae normal.  Cardiovascular:     Rate and Rhythm: Normal rate and regular rhythm.     Heart sounds: Normal heart sounds. No murmur heard.    No friction rub. No gallop.  Pulmonary:     Effort: Pulmonary effort is normal. No respiratory distress.     Breath sounds: No stridor. Wheezing (Mild over mid lung fields bilaterally) present. No rhonchi or rales.  Musculoskeletal:     Cervical back: Normal range of motion and neck supple. No rigidity. No muscular tenderness.  Neurological:     General: No focal  deficit present.     Mental Status: He is alert and oriented to person, place, and time.  Psychiatric:        Mood and Affect: Mood normal.        Behavior: Behavior normal.        Thought Content: Thought content normal.     Assessment and Plan :   PDMP not reviewed this encounter.  1. Acute viral syndrome   2. Mild intermittent asthma without complication   3. Wheezing     In the context of his respiratory symptoms, asthma and smoking recommended oral prednisone course, albuterol inhaler.  Deferred imaging given clear cardiopulmonary exam, hemodynamically stable vital signs. Will manage for viral illness such as viral URI, viral syndrome, viral rhinitis, COVID-19.  Recommended supportive care. Offered scripts for symptomatic relief. Testing is pending. Counseled patient on potential for adverse effects with medications prescribed/recommended today, ER and return-to-clinic precautions discussed, patient verbalized understanding.   Patient should undergo treatment with Paxlovid should he test positive for COVID-19.   Wallis Bamberg, [place] 09/06/22 1157

## 2022-09-06 NOTE — ED Triage Notes (Signed)
Pt c/o cough, head/chest congestion x 4 days-NAD-steady gait 

## 2022-09-06 NOTE — Discharge Instructions (Signed)
We will notify you of your test results as they arrive and may take between about 24 hours.  I encourage you to sign up for MyChart if you have not already done so as this can be the easiest way for us to communicate results to you online or through a phone app.  Generally, we only contact you if it is a positive test result.  In the meantime, if you develop worsening symptoms including fever, chest pain, shortness of breath despite our current treatment plan then please report to the emergency room as this may be a sign of worsening status from possible viral infection.  Otherwise, we will manage this as a viral syndrome. For sore throat or cough try using a honey-based tea. Use 3 teaspoons of honey with juice squeezed from half lemon. Place shaved pieces of ginger into 1/2-1 cup of water and warm over stove top. Then mix the ingredients and repeat every 4 hours as needed. Please take Tylenol 500mg-650mg every 6 hours for aches and pains, fevers. Hydrate very well with at least 2 liters of water. Eat light meals such as soups to replenish electrolytes and soft fruits, veggies. Start an antihistamine like Zyrtec for postnasal drainage, sinus congestion.  You can take this together with prednisone and albuterol.  Use the cough medications as needed.   

## 2022-09-07 LAB — SARS CORONAVIRUS 2 (TAT 6-24 HRS): SARS Coronavirus 2: NEGATIVE

## 2024-05-16 ENCOUNTER — Encounter (HOSPITAL_COMMUNITY): Payer: Self-pay | Admitting: *Deleted

## 2024-05-16 ENCOUNTER — Ambulatory Visit (HOSPITAL_COMMUNITY): Admission: EM | Admit: 2024-05-16 | Discharge: 2024-05-16 | Disposition: A | Discharge status: Home / Self Care

## 2024-05-16 DIAGNOSIS — L0291 Cutaneous abscess, unspecified: Secondary | ICD-10-CM

## 2024-05-16 MED ORDER — SULFAMETHOXAZOLE-TRIMETHOPRIM 800-160 MG PO TABS: 2.0000 | ORAL_TABLET | Freq: Two times a day (BID) | ORAL | 0 refills | Status: AC

## 2024-05-16 NOTE — ED Triage Notes (Signed)
 Pt states he has a cyst on his left inner thigh X 5 days he has done warm compress but no relief.

## 2024-05-16 NOTE — ED Provider Notes (Signed)
 [facility]    CSN: [id number] Arrival date & time: 05/16/24  1027    HISTORY   Chief Complaint  Patient presents with   Abscess   HPI Robert Strong is a pleasant, 33 y.o. male who presents to urgent care today. Patient reports a history of folliculitis which typically resolves with topical antibiotic treatment.  Patient states he has a lesion in his left inner thigh to his groin that has not responded to topical treatment, has gotten much bigger over the past 5 days and is now erythematous and tender to touch.  Patient states he has been applying a warm compress to the area without meaningful relief.  Denies fever, red streaking, drainage from lesion.  The history is provided by the patient.  Abscess  Past Medical History:  Diagnosis Date   Asthma    Kidney stone    There are no active problems to display for this patient.  History reviewed. No pertinent surgical history.  Home Medications    Prior to Admission medications   Medication Sig Start Date End Date Taking? Authorizing Provider  albuterol  (VENTOLIN  HFA) 108 (90 Base) MCG/ACT inhaler Inhale 1-2 puffs into the lungs every 6 (six) hours as needed for wheezing or shortness of breath. 09/06/22   Robert Savannah, PA-C  cetirizine  (ZYRTEC  ALLERGY) 10 MG tablet Take 1 tablet (10 mg total) by mouth daily. 09/06/22   Robert Savannah, PA-C  Omeprazole Magnesium (PRILOSEC PO)     [provider]  ondansetron  (ZOFRAN  ODT) 4 MG disintegrating tablet Take 1 tablet (4 mg total) by mouth every 8 (eight) hours as needed for nausea. 07/01/21   Robert Olam HERO, PA-C  oxyCODONE -acetaminophen  (PERCOCET) 5-325 MG tablet Take 1 tablet by mouth every 4 (four) hours as needed. 07/01/21   Robert Olam HERO, PA-C  predniSONE  (DELTASONE ) 20 MG tablet Take 2 tablets daily with breakfast. 09/06/22   Robert Savannah, PA-C  promethazine -dextromethorphan (PROMETHAZINE -DM) 6.25-15 MG/5ML syrup Take 5 mLs by mouth 3 (three) times daily as needed for  cough. 09/06/22   Robert Savannah, PA-C  tamsulosin  (FLOMAX ) 0.4 MG CAPS capsule Take 1 capsule (0.4 mg total) by mouth daily after supper. 07/01/21   Robert Olam HERO, PA-C    Family History History reviewed. No pertinent family history. Social History Social History   Tobacco Use   Smoking status: Some Days    Current packs/day: 0.50    Types: Cigarettes   Smokeless tobacco: Never  Vaping Use   Vaping status: Never Used  Substance Use Topics   Alcohol use: Yes    Comment: socially   Drug use: Yes   Allergies   Banana, Celery oil, Grapefruit extract, and Penicillins  Review of Systems Review of Systems Pertinent findings revealed after performing a 14 point review of systems has been noted in the history of present illness.  Physical Exam Vital Signs BP (!) 148/87 (BP Location: Right Arm)   Pulse 68   Temp 97.9 F (36.6 C) (Oral)   Resp 16   SpO2 96%   No data found.  Physical Exam Vitals and nursing note reviewed.  Constitutional:      General: He is awake. He is not in acute distress.    Appearance: Normal appearance. He is well-developed and well-groomed. He is not ill-appearing.  Skin:    Findings: Lesion (Top of left thigh at left groin, 3 cm area of induration and erythema with warmth to touch without central fluctuance or punctate) present.  Neurological:  Mental Status: He is alert.  Psychiatric:        Behavior: Behavior is cooperative.     Visual Acuity Right Eye Distance:   Left Eye Distance:   Bilateral Distance:    Right Eye Near:   Left Eye Near:    Bilateral Near:     UC Couse / Diagnostics / Procedures:     Radiology No results found.  Procedures Procedures (including critical care time) EKG  Pending results:  Labs Reviewed - No data to display  Medications Ordered in UC: Medications - No data to display  UC Diagnoses / Final Clinical Impressions(s)   I have reviewed the triage vital signs and the nursing notes.  Pertinent  labs & imaging results that were available during my care of the patient were reviewed by me and considered in my medical decision making (see chart for details).    Final diagnoses:  Abscess   Patient advised that I&D not recommended at this time due to lack of fluctuance of the lesion.  Patient provided with a prescription for Bactrim  double strength, 2 tablets twice daily for the next 7 days for treatment.  Okay to continue warm compress to the area.  Return precautions advised.  Please see discharge instructions below for details of plan of care as provided to patient. ED Prescriptions     Medication Sig Dispense Auth. Provider   sulfamethoxazole -trimethoprim  (BACTRIM  DS) 800-160 MG tablet Take 2 tablets by mouth 2 (two) times daily for 7 days. 28 tablet Robert Shaver Scales, PA-C      PDMP not reviewed this encounter.  Pending results:  Labs Reviewed - No data to display    Discharge Instructions      Please begin Bactrim  2 tablets twice daily for the next 7 days.  At this time, the area is not amenable to being lanced and drained.  You are welcome to continue applying warm compress to the area as well.  If you not see meaningful improvement in the next 2 to 3 days, please return for repeat evaluation or follow-up with your primary care provider.  Thank you for visiting  Urgent Care today.      Disposition Upon Discharge:  Condition: stable for discharge home  Patient presented with an acute illness with associated systemic symptoms and significant discomfort requiring urgent management. In my opinion, this is a condition that a prudent lay person (someone who possesses an average knowledge of health and medicine) may potentially expect to result in complications if not addressed urgently such as respiratory distress, impairment of bodily function or dysfunction of bodily organs.   Routine symptom specific, illness specific and/or disease specific  instructions were discussed with the patient and/or caregiver at length.   As such, the patient has been evaluated and assessed, work-up was performed and treatment was provided in alignment with urgent care protocols and evidence based medicine.  Patient/parent/caregiver has been advised that the patient may require follow up for further testing and treatment if the symptoms continue in spite of treatment, as clinically indicated and appropriate.  Patient/parent/caregiver has been advised to return to the [facility] or PCP if no better; to PCP or the Emergency Department if new signs and symptoms develop, or if the current signs or symptoms continue to change or worsen for further workup, evaluation and treatment as clinically indicated and appropriate  The patient will follow up with their current PCP if and as advised. If the patient does not currently have a PCP  we will assist them in obtaining one.   The patient may need specialty follow up if the symptoms continue, in spite of conservative treatment and management, for further workup, evaluation, consultation and treatment as clinically indicated and appropriate.  Patient/parent/caregiver verbalized understanding and agreement of plan as discussed.  All questions were addressed during visit.  Please see discharge instructions below for further details of plan.  This office note has been dictated using Teaching laboratory technician.  Unfortunately, this method of dictation can sometimes lead to typographical or grammatical errors.  I apologize for your inconvenience in advance if this occurs.  Please do not hesitate to reach out to me if clarification is needed.      Robert Shaver Scales, PA-C 05/16/24 [phone]

## 2024-05-16 NOTE — Discharge Instructions (Signed)
 Please begin Bactrim  2 tablets twice daily for the next 7 days.  At this time, the area is not amenable to being lanced and drained.  You are welcome to continue applying warm compress to the area as well.  If you not see meaningful improvement in the next 2 to 3 days, please return for repeat evaluation or follow-up with your primary care provider.  Thank you for visiting [facility] today.

## 2024-09-05 ENCOUNTER — Emergency Department (HOSPITAL_COMMUNITY)

## 2024-09-05 ENCOUNTER — Emergency Department (HOSPITAL_COMMUNITY)
Admission: EM | Admit: 2024-09-05 | Discharge: 2024-09-05 | Disposition: A | Discharge status: Home / Self Care | Source: Ambulatory Visit | Attending: Emergency Medicine | Admitting: Emergency Medicine

## 2024-09-05 DIAGNOSIS — M545 Low back pain, unspecified: Secondary | ICD-10-CM | POA: Insufficient documentation

## 2024-09-05 DIAGNOSIS — J45909 Unspecified asthma, uncomplicated: Secondary | ICD-10-CM | POA: Insufficient documentation

## 2024-09-05 DIAGNOSIS — R569 Unspecified convulsions: Secondary | ICD-10-CM | POA: Diagnosis not present

## 2024-09-05 LAB — CBC WITH DIFFERENTIAL/PLATELET
Abs Immature Granulocytes: 0.03 K/uL (ref 0.00–0.07)
Basophils Absolute: 0.1 K/uL (ref 0.0–0.1)
Basophils Relative: 1 %
Eosinophils Absolute: 0.4 K/uL (ref 0.0–0.5)
Eosinophils Relative: 5 %
HCT: 48.9 % (ref 39.0–52.0)
Hemoglobin: 16.4 g/dL (ref 13.0–17.0)
Immature Granulocytes: 0 %
Lymphocytes Relative: 33 %
Lymphs Abs: 2.3 K/uL (ref 0.7–4.0)
MCH: 30.9 pg (ref 26.0–34.0)
MCHC: 33.5 g/dL (ref 30.0–36.0)
MCV: 92.1 fL (ref 80.0–100.0)
Monocytes Absolute: 0.6 K/uL (ref 0.1–1.0)
Monocytes Relative: 8 %
Neutro Abs: 3.6 K/uL (ref 1.7–7.7)
Neutrophils Relative %: 53 %
Platelets: 260 K/uL (ref 150–400)
RBC: 5.31 MIL/uL (ref 4.22–5.81)
RDW: 12.8 % (ref 11.5–15.5)
WBC: 6.9 K/uL (ref 4.0–10.5)
nRBC: 0 % (ref 0.0–0.2)

## 2024-09-05 LAB — COMPREHENSIVE METABOLIC PANEL WITH GFR
ALT: 26 U/L (ref 0–44)
AST: 22 U/L (ref 15–41)
Albumin: 4.4 g/dL (ref 3.5–5.0)
Alkaline Phosphatase: 58 U/L (ref 38–126)
Anion gap: 11 (ref 5–15)
BUN: 14 mg/dL (ref 6–20)
CO2: 25 mmol/L (ref 22–32)
Calcium: 9.9 mg/dL (ref 8.9–10.3)
Chloride: 103 mmol/L (ref 98–111)
Creatinine, Ser: 0.8 mg/dL (ref 0.61–1.24)
GFR, Estimated: >60 mL/min
Glucose, Bld: 86 mg/dL (ref 70–99)
Potassium: 4.3 mmol/L (ref 3.5–5.1)
Sodium: 139 mmol/L (ref 135–145)
Total Bilirubin: 0.6 mg/dL (ref 0.0–1.2)
Total Protein: 8.1 g/dL (ref 6.5–8.1)

## 2024-09-05 LAB — URINE DRUG SCREEN
Amphetamines: NEGATIVE
Barbiturates: NEGATIVE
Benzodiazepines: NEGATIVE
Cocaine: NEGATIVE
Fentanyl: NEGATIVE
Methadone Scn, Ur: NEGATIVE
Opiates: NEGATIVE
Tetrahydrocannabinol: POSITIVE — AB

## 2024-09-05 LAB — URINALYSIS, ROUTINE W REFLEX MICROSCOPIC
Bilirubin Urine: NEGATIVE
Glucose, UA: NEGATIVE mg/dL
Hgb urine dipstick: NEGATIVE
Ketones, ur: NEGATIVE mg/dL
Leukocytes,Ua: NEGATIVE
Nitrite: NEGATIVE
Protein, ur: NEGATIVE mg/dL
Specific Gravity, Urine: 1.025 (ref 1.005–1.030)
pH: 6 (ref 5.0–8.0)

## 2024-09-05 LAB — ETHANOL: Alcohol, Ethyl (B): <15 mg/dL

## 2024-09-05 MED ORDER — METHOCARBAMOL 500 MG PO TABS: 500.0000 mg | ORAL_TABLET | Freq: Two times a day (BID) | ORAL | 0 refills | Status: AC

## 2024-09-05 NOTE — Discharge Instructions (Addendum)
 You were seen in the ER today for concerns of loss of consciousness and back pain. Your labs and imaging were thankfully reassuring with no significant abnormal findings seen. I have referred you to neurology for further evaluation of this episode you experienced as it appears this may have been a seizure and you would benefit from further evaluation. For concerns of repeat episodes or worsening symptoms, return to the ER.

## 2024-09-05 NOTE — ED Provider Notes (Signed)
 " [place] [facility] AT [facility] Provider Note   CSN: [id number] Arrival date & time: 09/05/24  9052     Patient presents with: Back Pain and Loss of Consciousness   Robert Strong is a 34 y.o. male.  Patient with past history significant for asthma presents to the emergency department with concerns of loss of consciousness.  Patient was seen had a walk in urgent care clinic and advised to come in for evaluation due to concerns of an episode that happened Thursday night/Friday morning.  He reports that Thursday night into Friday, had an episode where he cannot recall exactly what happened in which he believes he may have fallen off the bed but unsure if he may have had a seizure.  He reports that he woke up at some point without recollection of any of the events that happened before this but had noted bleeding from his tongue and pain in his low back.  He states that then he had an episode of hematemesis.  He was able to fall asleep without any difficulty and woke up but says no clear recollection what happened at that time.  He denies any recent alcohol or drug use beyond a THC gummy several days ago.  He does state that he has had several months of what he describes as weird spells of a sensation of dj vu with some nausea around that time without any other acute symptoms.  Reports these will transiently occur and resolve on their own.  He does endorse a family history of seizure disorder in his mother and sister but he denies any seizure activity or ever having experienced concerns for seizure activity as a child or young adult.  Denies any recent alcohol use.   Back Pain Loss of Consciousness      Prior to Admission medications  Medication Sig Start Date End Date Taking? Authorizing Provider  methocarbamol  (ROBAXIN ) 500 MG tablet Take 1 tablet (500 mg total) by mouth 2 (two) times daily. 09/05/24  Yes Tyress Loden A, PA-C  albuterol  (VENTOLIN  HFA) 108 (90 Base)  MCG/ACT inhaler Inhale 1-2 puffs into the lungs every 6 (six) hours as needed for wheezing or shortness of breath. 09/06/22   Christopher Savannah, PA-C  cetirizine  (ZYRTEC  ALLERGY) 10 MG tablet Take 1 tablet (10 mg total) by mouth daily. 09/06/22   Christopher Savannah, PA-C  Omeprazole Magnesium (PRILOSEC PO)     [provider]  ondansetron  (ZOFRAN  ODT) 4 MG disintegrating tablet Take 1 tablet (4 mg total) by mouth every 8 (eight) hours as needed for nausea. 07/01/21   Jarold Olam HERO, PA-C  oxyCODONE -acetaminophen  (PERCOCET) 5-325 MG tablet Take 1 tablet by mouth every 4 (four) hours as needed. 07/01/21   Jarold Olam HERO, PA-C  predniSONE  (DELTASONE ) 20 MG tablet Take 2 tablets daily with breakfast. 09/06/22   Christopher Savannah, PA-C  promethazine -dextromethorphan (PROMETHAZINE -DM) 6.25-15 MG/5ML syrup Take 5 mLs by mouth 3 (three) times daily as needed for cough. 09/06/22   Christopher Savannah, PA-C  tamsulosin  (FLOMAX ) 0.4 MG CAPS capsule Take 1 capsule (0.4 mg total) by mouth daily after supper. 07/01/21   Jarold Olam HERO, PA-C    Allergies: Banana, Celery oil, Grapefruit extract, and Penicillins    Review of Systems  Cardiovascular:  Positive for syncope.  Musculoskeletal:  Positive for back pain.  Neurological:  Positive for syncope.  All other systems reviewed and are negative.   Updated Vital Signs BP (!) 150/104   Pulse 66  Temp 98 F (36.7 C) (Oral)   Resp 18   Wt 107.5 kg   SpO2 100%   BMI 34.01 kg/m   Physical Exam Vitals and nursing note reviewed.  Constitutional:      General: He is not in acute distress.    Appearance: He is well-developed.  HENT:     Head: Normocephalic and atraumatic.     Comments: No discernable tongue laceration. Picture from date of event does show small laceration to the right anterior portion of tongue that appears to have now resolved. Eyes:     Conjunctiva/sclera: Conjunctivae normal.  Cardiovascular:     Rate and Rhythm: Normal rate and regular rhythm.     Heart  sounds: No murmur heard. Pulmonary:     Effort: Pulmonary effort is normal. No respiratory distress.     Breath sounds: Normal breath sounds.  Abdominal:     Palpations: Abdomen is soft.     Tenderness: There is no abdominal tenderness.  Musculoskeletal:        General: No swelling.     Cervical back: Neck supple.  Skin:    General: Skin is warm and dry.     Capillary Refill: Capillary refill takes less than 2 seconds.     Findings: No bruising or lesion.  Neurological:     Mental Status: He is alert.  Psychiatric:        Mood and Affect: Mood normal.     (all labs ordered are listed, but only abnormal results are displayed) Labs Reviewed  URINE DRUG SCREEN - Abnormal; Notable for the following components:      Result Value   Tetrahydrocannabinol POSITIVE (*)    All other components within normal limits  CBC WITH DIFFERENTIAL/PLATELET  COMPREHENSIVE METABOLIC PANEL WITH GFR  URINALYSIS, ROUTINE W REFLEX MICROSCOPIC  ETHANOL    EKG: EKG Interpretation Date/Time:  Tuesday September 05 2024 12:31:15 EST Ventricular Rate:  71 PR Interval:  155 QRS Duration:  107 QT Interval:  396 QTC Calculation: 431 R Axis:   117  Text Interpretation: Sinus rhythm Right axis deviation Confirmed by Yolande Charleston [phone]) on 09/05/2024 2:31:11 PM  Radiology: CT Lumbar Spine Wo Contrast Result Date: 09/05/2024 EXAM: CT OF THE LUMBAR SPINE WITHOUT CONTRAST 09/05/2024 01:15:55 PM TECHNIQUE: CT of the lumbar spine was performed without the administration of intravenous contrast. Multiplanar reformatted images are provided for review. Automated exposure control, iterative reconstruction, and/or weight based adjustment of the mA/kV was utilized to reduce the radiation dose to as low as reasonably achievable. COMPARISON: Thoracic spine CT reported separately today and CT abdomen and pelvis 07/01/2021. CLINICAL HISTORY: 34 year old male with new-onset seizure, syncope or presyncope, spine pain,  confusion, injuries, and tongue bite 4 days ago. FINDINGS: Normal lumbar segmentation confirmed with thoracic spine today. BONES AND ALIGNMENT: Normal vertebral body heights. Normal lordosis. Lumbar vertebrae, visible sacrum and SI joints appear stable since prior CT and intact. Bone mineralization within normal limits. No acute fracture or suspicious bone lesion. SOFT TISSUES: Negative non-contrast lumbar paraspinal soft tissues and visible abdominal and pelvic viscera. DEGENERATIVE CHANGES: No significant lumbar spine degeneration or lumbar spinal stenosis by CT. IMPRESSION: 1. No acute traumatic injury identified in the lumbar spine. Electronically signed by: Helayne Hurst MD 09/05/2024 01:45 PM EST RP Workstation: [id number]   CT Thoracic Spine Wo Contrast Result Date: 09/05/2024 EXAM: CT THORACIC SPINE WITHOUT CONTRAST 09/05/2024 01:15:21 PM TECHNIQUE: CT of the thoracic spine was performed without the administration of intravenous contrast. Multiplanar reformatted  images are provided for review. Automated exposure control, iterative reconstruction, and/or weight based adjustment of the mA/kV was utilized to reduce the radiation dose to as low as reasonably achievable. COMPARISON: Lumbar spine CT today and CT abdomen and pelvis 07/01/2021. CLINICAL HISTORY: 34 year old male with new-onset seizure, syncope or presyncope, spine pain, confusion, injuries, and tongue bite 4 days ago. FINDINGS: Thoracic spine segmentation is normal. BONES AND ALIGNMENT: Relatively normal kyphosis. Background bone mineralization within normal limits. Visible posterior ribs appear intact. Normal cervicothoracic junction alignment. Visible lower cervical vertebrae through the T3 vertebral bodies appear intact. T4 mild superior endplate compression is mildly sclerotic, age indeterminate. 12% loss of vertebral body height. No retropulsion, and T4 pedicles and posterior elements appear intact. Similar T5 superior endplate compression, age  indeterminate. 13% loss of vertebral body height. No retropulsion. T5 pedicles and posterior elements intact. T6 mild inferior endplate irregularity and sclerosis appears chronic. Similar T7 inferior endplate irregularity and sclerosis, but also mild T7 superior endplate compression. 4% loss of vertebral body height. And there is slightly more pronounced T8 superior endplate compression. 11% loss of vertebral body height. Mild more chronic appearing T9 superior endplate compression with similar loss of height. This finding is confirmed to be new from the previous 2022 CT. T9 pedicles and posterior elements intact with no retropulsion. T10 vertebral body compression with mild superior and inferior endplate deformities. 20% loss of vertebral body height compared to 2022. No retropulsion or posterior element fracture. Less pronounced T11 superior endplate compression also age indeterminate. 14% loss of vertebral body height compared to 2022. No retropulsion or posterior element fracture. T12 appears stable and intact. DEGENERATIVE CHANGES: No significant retropulsion. No CT evidence of thoracic spinal stenosis. Degenerative and/or posttraumatic vacuum disc at T6-T7 through T10-T11 appears new or increased since 2022. SOFT TISSUES: Negative non-contrast thoracic paraspinal soft tissues. Negative visible non-contrast chest and upper abdominal viscera. IMPRESSION: 1. Numerous thoracic compression deformities, most are age indeterminate, and in the lower thoracic spine are new from 2022 CT abdomen and pelvis. These include: T4 (12% losss of height), T5 (13%), T7 (4%), T8 (11%), T9 (new), T10 (20%), and T11 (14%). No retropulsion no complicating features. Underlying bone mineralization seems within normal limits. 2. 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 Electronically signed by: Helayne Hurst MD 09/05/2024 01:42 PM EST RP Workstation: [id number]   CT Head Wo  Contrast Result Date: 09/05/2024 EXAM: CT HEAD WITHOUT CONTRAST 09/05/2024 01:12:28 PM TECHNIQUE: CT of the head was performed without the administration of intravenous contrast. Automated exposure control, iterative reconstruction, and/or weight based adjustment of the mA/kV was utilized to reduce the radiation dose to as low as reasonably achievable. COMPARISON: None available. CLINICAL HISTORY: 34 year old male with new-onset seizure, syncope/presyncope, spine pain, confusion, injuries, and tongue bite 4 days ago. FINDINGS: BRAIN AND VENTRICLES: No acute hemorrhage. No evidence of acute infarct. No hydrocephalus. No extra-axial collection. No mass effect or midline shift. Normal brain volume. Gray white differentiation within normal limits. Normal basilar cisterns. No suspicious intracranial vascular hyperdensity. ORBITS: No acute abnormality. SINUSES: Mild to moderate scattered paranasal sinus mucosal thickening with some bubbly opacity. No sinus hemorrhage. SOFT TISSUES AND SKULL: No acute soft tissue abnormality. No skull fracture. No discrete orbital scalp soft tissue injury identified. Tympanic cavities and mastoids are clear. IMPRESSION: 1. Normal for age non-contrast CT appearance of the brain. 2. Mild to moderate paranasal sinus inflammation. Electronically signed by: Helayne Hurst MD 09/05/2024 01:32 PM EST RP  Workstation: [id number]     Procedures   Medications Ordered in the ED - No data to display                                  Medical Decision Making Amount and/or Complexity of Data Reviewed Labs: ordered. Radiology: ordered.  Risk Prescription drug management.   This patient presents to the ED for concern of syncope, this involves an extensive number of treatment options, and is a complaint that carries with it a high risk of complications and morbidity.  The differential diagnosis includes syncopal episode, substance use, dehydration, seizure like activity, lumbar strain, lumbar  fracture   Co morbidities that complicate the patient evaluation  Asthma   Additional history obtained:  Additional history obtained from chart review   Lab Tests:  I Ordered, and personally interpreted labs.  The pertinent results include: CBC unremarkable, UA without signs of infection, CMP unremarkable, ethanol negative, UDS positive for cannabis   Imaging Studies ordered:  I ordered imaging studies including CT head, CT thoracic spine, CT lumbar spine I independently visualized and interpreted imaging which showed negative for any acute findings of the thoracic, lumbar spine, or head I agree with the radiologist interpretation   Cardiac Monitoring: / EKG:  The patient was maintained on a cardiac monitor.  I personally viewed and interpreted the cardiac monitored which showed an underlying rhythm of: Sinus rhythm with   Consultations Obtained:  I requested consultation with none,  and discussed lab and imaging findings as well as pertinent plan - they recommend: N/A and   Problem List / ED Course / Critical interventions / Medication management  Patient past history significant for asthma presents to the emergency department concerns of back pain and loss of consciousness.  Reports about 5 days ago on New Year's Day had a suspected syncopal episode in which he does not have clear recollection of what happened but woke up confused with laceration to the tongue and pain in his low back.  Unclear if he may have had a seizure or he fell off his bed.  He does report family history of seizures with mother and sister affected.  He denies any substance use.  No alcohol use recently. Exam reveals tenderness to the midline thoracic and lumbar spine.  No abnormal heart or lung sounds.  No obvious or clear discernible tongue lacerations.  Patient had a photo on his phone showing injury to the right anterior portion of the tongue that occurred immediately after this episode that appears  to now have largely healed. Based on reported symptoms, unclear exact cause of what has occurred or what truly transpired.  There may have been a seizure type activity that occurred given the concern for tongue lacerations.  With no history of seizure, will workup for possible metabolic versus substance versus ethanol use versus tumor versus stroke.  Will also obtain imaging of the thoracic lumbar spine for evaluation of back pain in this area.  Imaging of the head, lumbar and thoracic spines negative for acute findings.  Lab workup unremarkable.  With a reassuring workup, unclear exact cause of his current episode he reported several days ago.  Unclear if this was a seizure versus single episode versus other.  No family history of seizure and reported tongue lacerations, I am concerned for possible seizure episode.  Referral to neurology placed.  Advised patient against driving or operating heavy machinery until  cleared or at least over the next 6 months.  For any concerns or new or worsening symptoms, advised return to the emergency department.  Discharged home in stable condition. I have reviewed the patients home medicines and have made adjustments as needed   Social Determinants of Health:  None   Test / Admission - Considered:  Considered but stable for outpatient follow-up.  Final diagnoses:  Seizure-like activity (HCC)  Acute midline low back pain without sciatica    ED Discharge Orders          Ordered    methocarbamol  (ROBAXIN ) 500 MG tablet  2 times daily        09/05/24 1504    Ambulatory referral to Neurology       Comments: An appointment is requested in approximately: 1 week   09/05/24 1506               Cecily Legrand LABOR, PA-C 09/05/24 2145  "

## 2024-09-05 NOTE — ED Triage Notes (Addendum)
 [facility]. Main concern is mid to lower spinal pain. Thursday night 'something happened'. Woke up Friday not knowing where he was on what happened. Had scrathed himself in multiple areas. Bit his tongue, vomiting blood.  Went to sleep, no drugs or alcohol use, and woke up confused. Has also been having 'weird spells' of deja vu feelings with nausea. Occurs once or twice weekly. Family history of seizures.

## 2024-12-15 ENCOUNTER — Ambulatory Visit: Admitting: Neurology
